# Patient Record
Sex: Female | Born: 1960 | Race: White | Hispanic: No | Marital: Married | Smoking: Never smoker
Health system: Southern US, Community
[De-identification: ages and names within clinical notes are randomized; demographics above are authoritative.]

## PROBLEM LIST (undated history)

## (undated) DIAGNOSIS — F329 Major depressive disorder, single episode, unspecified: Secondary | ICD-10-CM

## (undated) DIAGNOSIS — K259 Gastric ulcer, unspecified as acute or chronic, without hemorrhage or perforation: Secondary | ICD-10-CM

## (undated) DIAGNOSIS — D649 Anemia, unspecified: Secondary | ICD-10-CM

## (undated) DIAGNOSIS — K449 Diaphragmatic hernia without obstruction or gangrene: Secondary | ICD-10-CM

## (undated) DIAGNOSIS — F209 Schizophrenia, unspecified: Secondary | ICD-10-CM

## (undated) DIAGNOSIS — F32A Depression, unspecified: Secondary | ICD-10-CM

## (undated) HISTORY — DX: Schizophrenia, unspecified: F20.9

## (undated) HISTORY — DX: Anemia, unspecified: D64.9

## (undated) HISTORY — DX: Gastric ulcer, unspecified as acute or chronic, without hemorrhage or perforation: K25.9

## (undated) HISTORY — PX: OTHER SURGICAL HISTORY: SHX169

## (undated) HISTORY — DX: Depression, unspecified: F32.A

## (undated) HISTORY — DX: Major depressive disorder, single episode, unspecified: F32.9

## (undated) HISTORY — DX: Diaphragmatic hernia without obstruction or gangrene: K44.9

---

## 2007-05-27 ENCOUNTER — Encounter: Admission: RE | Admit: 2007-05-27 | Discharge: 2007-05-27 | Payer: Self-pay | Admitting: Obstetrics & Gynecology

## 2008-06-26 ENCOUNTER — Ambulatory Visit: Payer: Self-pay | Admitting: *Deleted

## 2008-06-26 ENCOUNTER — Ambulatory Visit: Payer: Self-pay | Admitting: Internal Medicine

## 2010-09-30 ENCOUNTER — Inpatient Hospital Stay (HOSPITAL_COMMUNITY)
Admission: EM | Admit: 2010-09-30 | Discharge: 2010-10-04 | Payer: Self-pay | Source: Home / Self Care | Attending: Internal Medicine | Admitting: Internal Medicine

## 2010-09-30 LAB — URINE MICROSCOPIC-ADD ON

## 2010-09-30 LAB — URINALYSIS, ROUTINE W REFLEX MICROSCOPIC
Bilirubin Urine: NEGATIVE
Nitrite: NEGATIVE
Protein, ur: NEGATIVE mg/dL
Specific Gravity, Urine: 1.013 (ref 1.005–1.030)
Urobilinogen, UA: 0.2 mg/dL (ref 0.0–1.0)

## 2010-09-30 LAB — BASIC METABOLIC PANEL
BUN: 4 mg/dL — ABNORMAL LOW (ref 6–23)
CO2: 20 mEq/L (ref 19–32)
Chloride: 111 mEq/L (ref 96–112)
Glucose, Bld: 93 mg/dL (ref 70–99)
Potassium: 3.7 mEq/L (ref 3.5–5.1)
Sodium: 141 mEq/L (ref 135–145)

## 2010-09-30 LAB — POCT PREGNANCY, URINE: Preg Test, Ur: NEGATIVE

## 2010-09-30 LAB — DIFFERENTIAL
Basophils Relative: 0 % (ref 0–1)
Eosinophils Absolute: 0.1 10*3/uL (ref 0.0–0.7)
Eosinophils Relative: 2 % (ref 0–5)
Lymphocytes Relative: 33 % (ref 12–46)
Neutro Abs: 3.6 10*3/uL (ref 1.7–7.7)
Neutrophils Relative %: 59 % (ref 43–77)

## 2010-09-30 LAB — CBC
HCT: 16.2 % — ABNORMAL LOW (ref 36.0–46.0)
MCH: 20.3 pg — ABNORMAL LOW (ref 26.0–34.0)
MCV: 74.7 fL — ABNORMAL LOW (ref 78.0–100.0)
RBC: 2.17 MIL/uL — ABNORMAL LOW (ref 3.87–5.11)
WBC: 6.1 10*3/uL (ref 4.0–10.5)

## 2010-10-01 DIAGNOSIS — D649 Anemia, unspecified: Secondary | ICD-10-CM

## 2010-10-01 LAB — FOLATE: Folate: 18.2 ng/mL

## 2010-10-01 LAB — VITAMIN B12: Vitamin B-12: 379 pg/mL (ref 211–911)

## 2010-10-01 LAB — HEMOGLOBIN AND HEMATOCRIT, BLOOD
HCT: 15.9 % — ABNORMAL LOW (ref 36.0–46.0)
Hemoglobin: 3.9 g/dL — CL (ref 12.0–15.0)
Hemoglobin: 4.3 g/dL — CL (ref 12.0–15.0)
Hemoglobin: 4 g/dL — CL (ref 12.0–15.0)

## 2010-10-01 LAB — IRON AND TIBC
Iron: 30 ug/dL — ABNORMAL LOW (ref 42–135)
TIBC: 447 ug/dL (ref 250–470)
UIBC: >450 ug/dL

## 2010-10-01 LAB — FERRITIN
Ferritin: 19 ng/mL (ref 10–291)
Ferritin: 17 ng/mL (ref 10–291)

## 2010-10-02 LAB — CBC
Hemoglobin: 4 g/dL — CL (ref 12.0–15.0)
MCH: 21.4 pg — ABNORMAL LOW (ref 26.0–34.0)
MCHC: 28 g/dL — ABNORMAL LOW (ref 30.0–36.0)
MCV: 76.2 fL — ABNORMAL LOW (ref 78.0–100.0)
MCV: 76.7 fL — ABNORMAL LOW (ref 78.0–100.0)
Platelets: 211 10*3/uL (ref 150–400)
Platelets: 223 10*3/uL (ref 150–400)
RBC: 1.93 MIL/uL — ABNORMAL LOW (ref 3.87–5.11)
RBC: 2.1 MIL/uL — ABNORMAL LOW (ref 3.87–5.11)
RDW: 21.6 % — ABNORMAL HIGH (ref 11.5–15.5)
WBC: 5.3 10*3/uL (ref 4.0–10.5)

## 2010-10-02 LAB — BASIC METABOLIC PANEL
BUN: 4 mg/dL — ABNORMAL LOW (ref 6–23)
Calcium: 8.5 mg/dL (ref 8.4–10.5)
Chloride: 105 mEq/L (ref 96–112)
Creatinine, Ser: 0.72 mg/dL (ref 0.4–1.2)

## 2010-10-02 LAB — HEMOGLOBIN AND HEMATOCRIT, BLOOD
HCT: 15.1 % — ABNORMAL LOW (ref 36.0–46.0)
HCT: 15.4 % — ABNORMAL LOW (ref 36.0–46.0)

## 2010-10-02 LAB — MAGNESIUM: Magnesium: 2.4 mg/dL (ref 1.5–2.5)

## 2010-10-03 LAB — FOLATE RBC: RBC Folate: 1145 ng/mL — ABNORMAL HIGH (ref 180–600)

## 2010-10-04 LAB — IGG, IGA, IGM
IgA: 412 mg/dL — ABNORMAL HIGH (ref 68–378)
IgM, Serum: 74 mg/dL (ref 60–263)

## 2010-10-04 LAB — PROTEIN ELECTROPH W RFLX QUANT IMMUNOGLOBULINS
Alpha-1-Globulin: 5.3 % — ABNORMAL HIGH (ref 2.9–4.9)
Alpha-2-Globulin: 12.6 % — ABNORMAL HIGH (ref 7.1–11.8)
Total Protein ELP: 6.8 g/dL (ref 6.0–8.3)

## 2010-10-04 LAB — ERYTHROPOIETIN: Erythropoietin: 2995 m[IU]/mL — ABNORMAL HIGH (ref 2.6–34.0)

## 2010-10-04 LAB — CBC
Hemoglobin: 4.6 g/dL — CL (ref 12.0–15.0)
MCH: 22.2 pg — ABNORMAL LOW (ref 26.0–34.0)
RBC: 2.07 MIL/uL — ABNORMAL LOW (ref 3.87–5.11)

## 2010-10-12 ENCOUNTER — Other Ambulatory Visit: Payer: Self-pay | Admitting: Oncology

## 2010-10-12 ENCOUNTER — Encounter (HOSPITAL_BASED_OUTPATIENT_CLINIC_OR_DEPARTMENT_OTHER): Payer: PRIVATE HEALTH INSURANCE | Admitting: Oncology

## 2010-10-12 ENCOUNTER — Encounter: Payer: PRIVATE HEALTH INSURANCE | Admitting: Oncology

## 2010-10-12 DIAGNOSIS — D6489 Other specified anemias: Secondary | ICD-10-CM

## 2010-10-12 LAB — CBC & DIFF AND RETIC
Eosinophils Absolute: 0.1 10*3/uL (ref 0.0–0.5)
Immature Retic Fract: 20.4 % — ABNORMAL HIGH (ref 0.00–10.70)
MCHC: 29.3 g/dL — ABNORMAL LOW (ref 31.5–36.0)
MONO#: 0.4 10*3/uL (ref 0.1–0.9)
RBC: 3.35 10*6/uL — ABNORMAL LOW (ref 3.70–5.45)
Retic %: 6.38 % — ABNORMAL HIGH (ref 0.50–1.50)
WBC: 5.4 10*3/uL (ref 3.9–10.3)
nRBC: 0 % (ref 0–0)

## 2010-10-12 LAB — COMPREHENSIVE METABOLIC PANEL
ALT: 19 U/L (ref 0–35)
Albumin: 3.8 g/dL (ref 3.5–5.2)
BUN: 11 mg/dL (ref 6–23)
CO2: 23 mEq/L (ref 19–32)
Calcium: 9.5 mg/dL (ref 8.4–10.5)
Chloride: 99 mEq/L (ref 96–112)
Creatinine, Ser: 0.85 mg/dL (ref 0.40–1.20)

## 2010-10-12 LAB — MORPHOLOGY: PLT EST: ADEQUATE

## 2010-10-12 LAB — IRON AND TIBC: TIBC: 482 ug/dL — ABNORMAL HIGH (ref 250–470)

## 2010-10-12 LAB — FERRITIN: Ferritin: 390 ng/mL — ABNORMAL HIGH (ref 10–291)

## 2010-10-15 NOTE — Discharge Summary (Signed)
[name], Sarah Calderon                  ACCOUNT NO.:  [id number]  MEDICAL RECORD NO.:  [id number]          PATIENT TYPE:  INP  LOCATION:  4711                         FACILITY:  [facility]  PHYSICIAN:  Kathlen Mody, MD       DATE OF BIRTH:  06/13/1961  DATE OF ADMISSION:  09/30/2010 DATE OF DISCHARGE:  10/04/2010                              DISCHARGE SUMMARY   PRIMARY CARE PHYSICIAN:  Dr. Renette Butters, [facility].  [facility]  HEMATOLOGIST:  Drue Second, MD  DISCHARGE DIAGNOSES:  Profound anemia, iron deficiency anemia.  OTHER DIAGNOSES:  Schizophrenia, hiatal hernia, depression.  DISCHARGE MEDICATIONS: 1. Protonix 40 mg b.i.d. 2. Ferrous sulfate 1 tablet 3 times a day. 3. Thiothixene 5 mg p.o. daily. 4. Trihexyphenidyl 2 mg daily.  CONSULTS CALLED:  Gastroenterology consult from [facility], Hematology consult from Dr. Welton Flakes.  PERTINENT LABS:  The patient had urine pregnancy test which was negative.  Urinalysis which showed small leukocytes, trace blood, urobilinogen of 0.2, ketones of 15.  Point-of-care, stool for occult blood was negative.  CBC showed a WBC count of 6.1, RBC of 2.17, hemoglobin of 4.4, hematocrit of 16.2, platelets of 200.1, MCV of 74.7.  BMP showing sodium of 141, potassium of 3.7, chloride of 111, bicarb of 20, glucose of 93, BUN of 4, creatinine of 0.64.  H&H the next day showed a hemoglobin of 3.1, hematocrit of 14.1.  Anemia panel showing vitamin B12 of 379, folate of 18.02, iron level of 70.  LDH of 148.  Direct antiglobulin test negative.  H&H showed hemoglobin of 4 and hematocrit of 14.4, haptoglobin 226.  CBC showed a hemoglobin of 4, hematocrit of 14.7, and the stool for occult blood was negative.  Vitamin B12 showed 524.  On the day of admission, the patient's CBC showed a WBC count of 6.7, hemoglobin of 4.6, hematocrit of 16.2.  RADIOLOGY:  The patient had a chest x-ray which showed left  lung atelectasis and a large hiatal hernia, no acute findings.  BRIEF HOSPITAL COURSE: 1. This is a 50 year old lady with past medical history of     schizophrenia, large hiatal hernia, and anemia came into the     hospital complaining of shortness of breath and fatigue.  In the     ER, she was found to have profound anemia with a hemoglobin of 4.     The patient is a Jehovah's witness and she refused all kinds of     blood transfusions and blood product transfusions.  The patient was     told about the risk of not getting a blood transfusion and that     might eventually lead her to her death with complications of     profound anemia if blood transfusions were not given.  The patient     and the patient's family verbally understood the risk of not     receiving blood transfusion with such profound symptomatic anemia     __________ she is a Jehovah's witness and she refused this.  She     refused all blood transfusions and blood  product transfusions.  The     patient was later admitted to the floor and she was symptomatically    treated with IV fluids and she was started on Protonix     prophylactically and the specimen of stool for occult blood was     sent for lab.  Two specimens came back negative.  Hematology     basically gave Feraheme IV iron transfusion and asked the patient     to follow up as an outpatient in about 3-4 weeks.  A     gastroenterology consult was called.  The patient had     gastroenterology workup for same kind of anemia in 2008.  She had     colonoscopy done and small bowel series was also done by Dr.     Matthias Hughs.  Her colonoscopy was normal at that time.  A small bowel     series did not show any villous pathology.  The patient also had an     upper  endoscopy which showed hiatal hernia and mild     erosive esophagitis.  She was at that time started on Prevacid and     discharged home.  She had whole workup done by gastroenterology     under Dr. Matthias Hughs.   Gastroenterology consult was also called from     the [facility] physician.  Dr. Madilyn Fireman saw the patient and recommended     hematology consult and at least 2 sets of stool specimens were     occult blood which came back negative.  DISCHARGE PHYSICAL EXAMINATION:  VITAL SIGNS:  At the time of discharge, the patient's vitals include temperature of 98.1, pulse of 73 per minute, respirations of 18 per minute, blood pressure of 114/74, saturating 99% on room air. GENERAL:  She was alert, afebrile, oriented x3 in no acute distress. All symptoms of shortness of breath and fatigue have resolved at the time of discharge. HEENT:  Pale conjunctivae. NECK:  No JVD. CARDIOVASCULAR:  S1, S2 heard.  No rubs, murmurs, or gallops. RESPIRATORY:  Good air entry bilateral.  No adventitious sounds. ABDOMEN:  Soft, nontender, nondistended.  Good bowel sounds. EXTREMITIES: No pedal edema. NEUROLOGICAL:  Nonfocal.  The patient at this time is hemodynamically stable for discharge home to follow with Hematology in 3-4 weeks where she is to check her CBC and a possible repeat IV iron infusion.  She was also recommended to follow up with gastroenterology after the hematology followup.  At the time of discharge, the patient was also told about the risk of profound anemia and possible outcomes of not getting a blood transfusion.  She and the family at the bedside understood the risk and they said that they would come to the ER if they had any problems or issues.  Time spent with the with the care of the patient and the discharge summary is about 45 minutes.          ______________________________ Kathlen Mody, MD     VA/MEDQ  D:  10/07/2010  T:  10/07/2010  Job:  [id number]  Electronically Signed by Kathlen Mody MD on 10/15/2010 03:13:46 PM

## 2010-10-20 ENCOUNTER — Encounter (HOSPITAL_BASED_OUTPATIENT_CLINIC_OR_DEPARTMENT_OTHER): Payer: PRIVATE HEALTH INSURANCE | Admitting: Oncology

## 2010-10-20 DIAGNOSIS — D6489 Other specified anemias: Secondary | ICD-10-CM

## 2010-11-07 NOTE — H&P (Signed)
Sarah Calderon, Sarah Calderon                  ACCOUNT NO.:  [id number]  MEDICAL RECORD NO.:  [id number]          PATIENT TYPE:  INP  LOCATION:  1823                         FACILITY:  [facility]  PHYSICIAN:  Della Goo, M.D. DATE OF BIRTH:  1960/10/30  DATE OF ADMISSION:  09/30/2010 DATE OF DISCHARGE:                             HISTORY & PHYSICAL   DATE OF ADMISSION:  September 30, 2010.  PRIMARY CARE PHYSICIAN:  Unassigned, Dr. Renette Butters, [facility].  CHIEF COMPLAINT:  Shortness of breath, weakness, and fatigue.  HISTORY OF PRESENT ILLNESS:  This is a 50 year old female who was referred to the emergency department by her primary care physician Dr. Renette Butters, [facility] after a finding of a hemoglobin level of 4.4.  The patient had been seen in the office secondary to complaints of weakness, lightheadedness, shortness of breath, and dyspnea on exertion which she has had for 6 months and has been worsening.  She reports having dizziness, achiness, and muscle pain and chest soreness.  She denies having any syncope.  Denies having any fevers, chills.  Denies having any nausea, vomiting, diarrhea.  She also denies having any hematemesis, hematochezia, or melena passage.  She denies having any heavy menses.  She reports her menses has been irregular and states that she has vaginal spotting twice a year.  The patient was seen in the emergency department, evaluated and her hemoglobin level was found to be 4.4 on recheck in the emergency department.  Her hematocrit 16.2 and her MCV 74.7.  A rectal examination was performed by the EDP, results of which were heme negative.  The patient was found to be hemodynamically stable and referred for medical admission.  The patient reports 2 years ago having a GI workup by Dr. Matthias [facility]hs of [facility], results of which found esophagitis and a hiatal hernia and since that time she has been on Prevacid therapy.  PAST MEDICAL  HISTORY:  Significant for schizophrenia, anemia, hiatal hernia, depression.  PAST SURGICAL HISTORY:  History of an appendectomy.  MEDICATIONS: 1. Prevacid 20 mg p.o. daily p.r.n. 2. Thiothixene 5 mg one p.o. daily. 3. Trihexyphenidyl 2 mg one p.o. daily.  ALLERGIES:  To SHELLFISH which causes hives.  SOCIAL HISTORY:  The patient is married.  She works at [employer].  She is a nonsmoker.  She reports a rare alcohol usage and denies any illicit drug usage.  FAMILY HISTORY:  Positive for coronary artery disease in her father and her paternal grandfather.  Positive cancer, lung cancer in her maternal grandfather and her maternal grandfather also had diabetic disease.  REVIEW OF SYSTEMS:  Pertinents as mentioned above.  PHYSICAL EXAMINATION FINDINGS:  GENERAL:  This is a 50 year old obese Caucasian female in no acute distress. VITAL SIGNS:  Temperature 97.1, blood pressure 125/59, heart rate 79, respirations 20, O2 saturation initially 91%, now 100%.  HEENT: Normocephalic, atraumatic.  Pupils equally round and reactive to light. Extraocular movements are intact.  Funduscopic benign.  There is no scleral icterus.  The conjunctivae are pale.  Nares are patent bilaterally.  Oropharynx is clear.  Mucosa is pale as well.  There is no exudate or erythema. NECK:  Supple.  Full range of motion.  No thyromegaly, adenopathy, jugular venous distention. CARDIOVASCULAR:  Regular rate and rhythm.  No murmurs, gallops, or rubs appreciated. LUNGS:  Clear to auscultation bilaterally.  No rales, rhonchi, or wheezes. ABDOMEN:  Positive bowel sounds, soft, nontender, nondistended.  No hepatosplenomegaly. EXTREMITIES:  Without cyanosis, clubbing, or edema. NEUROLOGIC:  Nonfocal. RECTAL:  Performed by the EDP.  LABORATORY STUDIES:  White blood cell count 6.1, hemoglobin 4.4, hematocrit 16.2, MCV 74.7, platelets 241, neutrophils 59% lymphocytes 33%.  Sodium 141, potassium 3.7, chloride 111, carbon  dioxide 20, BUN 4, creatinine 0.64, and glucose 93, calcium 8.5.  Urinalysis reveals trace urine hemoglobin, small leukocyte esterase.  Urine microscopic many urine epithelials, so this was not a clean catch.  Urine white blood cells 3-6, urine red blood cells 0-2, and bacteria are few.  Chest x-ray reveals left lung base atelectasis and a large hiatal hernia, but no acute findings are seen.  ASSESSMENT:  A 50 year old female being admitted with: 1. Severe microcytic anemia. 2. Shortness of breath secondary severe microcytic anemia. 3. Gastroesophageal reflux disease. 4. Hiatal hernia. 5. Schizophrenia. 6. Morbid obesity.  PLAN:  The patient will be admitted to a telemetry area for monitoring. Please note that the patient is a Jehovah's Witness and refuses all types of blood products.  This has been discussed with her and she is steadfast that she will not accept blood products under any circumstances.  Hemoglobin levels will be checked q.8 hours x48 hours and her stools will be heme tested once daily for 3 days.  She will be placed on Protonix therapy 40 mg IV q.12 hours.  An anemia panel has been ordered and an Epogen level has also been ordered.  The patient will be put on clear liquids at this time and IV fluids have been ordered as well for maintenance therapy.  The patient has been placed on supplemental oxygen and her oxygen levels will be monitored.  A Hematology consultation will be requested in the a.m. for further workup in the event that this is a hemoglobinopathy or hemoglobin production problem.  However, a GI consultation will also be considered.  The patient will be placed in SCDs for DVT prophylaxis at this time and patient is a full code.    Della Goo, M.D.    HJ/MEDQ  D:  10/01/2010  T:  10/01/2010  Job:  [id number]  Electronically Signed by Della Goo M.D. on 11/07/2010 08:06:56 PM

## 2010-11-10 NOTE — Consult Note (Signed)
  Sarah Calderon, Sarah Calderon                  ACCOUNT NO.:  [id number]  MEDICAL RECORD NO.:  [id number]          PATIENT TYPE:  INP  LOCATION:  4711                         FACILITY:  [facility]  PHYSICIAN:  Loveah Like C. Madilyn Fireman, M.D.    DATE OF BIRTH:  1961/06/05  DATE OF CONSULTATION:  10/02/2010 DATE OF DISCHARGE:                                CONSULTATION   REASON FOR CONSULTATION:  Severe anemia.  HISTORY OF PRESENT ILLNESS:  The patient is a 50 year old white female, who presents with symptoms of fatigue and dyspnea on exertion, who was found to have a hemoglobin of 4.4 with an MCV of 74.7.  She states she has been followed intermittently for anemia, and has been recommended to have iron, but is not taking because it upsets her stomach.  She had a GI workup by Dr. Matthias Hughs in October 2008 with an EGD that showed a large estimated 10-cm hiatal hernia with Sheria Lang erosions as well as some linear erosions in her esophagus.  From limited availability of her reports, it appears there was a colonoscopy done on that day and the patient states that there was, but I could not find the report currently.  Apparently, no polyps were found.  She was recommended to take proton pump inhibitor and she does take Prevacid, but only on a p.r.n. basis for heartburn.  She states she has been taking Advil about 2 or 4 weeks.  She denies any hematochezia, black stools, change in bowel habits, dysphagia, nausea, vomiting, abdominal pain, or weight loss.  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  PAST MEDICAL HISTORY:  Schizophrenia, anemia, hiatal hernia, depression.  MEDICATIONS:  Prevacid, thiothixene, trihexyphenidyl.  ALLERGIES:  SHELLFISH.  SOCIAL HISTORY:  The patient is married.  She works in [employer].  She denies cigarette smoking.  Drinks alcohol rarely.  FAMILY HISTORY:  Apparently positive for anemia and some other relatives.  No specific diagnoses.  Negative for GI  malignancy.  PHYSICAL EXAMINATION:  GENERAL:  Well-developed, well-nourished white female in no acute distress, pale, alert and oriented. HEART:  Regular rate and rhythm without murmurs. LUNGS:  Clear. ABDOMEN:  Soft, nondistended with normoactive bowel sounds.  No hepatosplenomegaly, mass, or guarding.  LABORATORY DATA:  MCV 76.2, hemoglobin 4, platelets 211,000, WBC 5600.  IMPRESSION:  Anemia, unclear whether due to gastrointestinal blood loss with single heme-negative stool in the emergency room.  PLAN:  We will await ordered Hemoccults and we will search further for records of colonoscopy in 2008 to decide whether any endoscopic workup versus hematology evaluation is needed.  We will follow with you.          ______________________________ Everardo All. Madilyn Fireman, M.D.     JCH/MEDQ  D:  10/02/2010  T:  10/02/2010  Job:  [id number]  Electronically Signed by Dorena Cookey M.D. on 11/08/2010 07:04:24 PM

## 2010-11-17 ENCOUNTER — Other Ambulatory Visit: Payer: Self-pay | Admitting: Oncology

## 2010-11-17 ENCOUNTER — Encounter (HOSPITAL_BASED_OUTPATIENT_CLINIC_OR_DEPARTMENT_OTHER): Payer: PRIVATE HEALTH INSURANCE | Admitting: Oncology

## 2010-11-17 DIAGNOSIS — D6489 Other specified anemias: Secondary | ICD-10-CM

## 2010-11-17 LAB — IRON AND TIBC
%SAT: 16 % — ABNORMAL LOW (ref 20–55)
TIBC: 321 ug/dL (ref 250–470)
UIBC: 271 ug/dL

## 2010-11-17 LAB — CBC & DIFF AND RETIC
Basophils Absolute: 0 10*3/uL (ref 0.0–0.1)
EOS%: 1 % (ref 0.0–7.0)
HGB: 11.6 g/dL (ref 11.6–15.9)
LYMPH%: 15.9 % (ref 14.0–49.7)
MCH: 31 pg (ref 25.1–34.0)
MCV: 94.4 fL (ref 79.5–101.0)
MONO%: 5.1 % (ref 0.0–14.0)
NEUT%: 77.9 % — ABNORMAL HIGH (ref 38.4–76.8)
Platelets: 236 10*3/uL (ref 145–400)
RDW: 22.7 % — ABNORMAL HIGH (ref 11.2–14.5)

## 2010-12-27 ENCOUNTER — Other Ambulatory Visit: Payer: Self-pay | Admitting: Gastroenterology

## 2010-12-27 ENCOUNTER — Ambulatory Visit (HOSPITAL_COMMUNITY)
Admission: RE | Admit: 2010-12-27 | Discharge: 2010-12-27 | Disposition: A | Discharge status: Home / Self Care | Payer: PRIVATE HEALTH INSURANCE | Source: Ambulatory Visit | Attending: Gastroenterology | Admitting: Gastroenterology

## 2010-12-27 DIAGNOSIS — K5730 Diverticulosis of large intestine without perforation or abscess without bleeding: Secondary | ICD-10-CM | POA: Insufficient documentation

## 2010-12-27 DIAGNOSIS — D509 Iron deficiency anemia, unspecified: Secondary | ICD-10-CM | POA: Insufficient documentation

## 2010-12-27 DIAGNOSIS — K449 Diaphragmatic hernia without obstruction or gangrene: Secondary | ICD-10-CM | POA: Insufficient documentation

## 2011-01-17 ENCOUNTER — Encounter (HOSPITAL_BASED_OUTPATIENT_CLINIC_OR_DEPARTMENT_OTHER): Payer: PRIVATE HEALTH INSURANCE | Admitting: Oncology

## 2011-01-17 ENCOUNTER — Other Ambulatory Visit: Payer: Self-pay | Admitting: Oncology

## 2011-01-17 DIAGNOSIS — D6489 Other specified anemias: Secondary | ICD-10-CM

## 2011-01-17 LAB — CBC WITH DIFFERENTIAL/PLATELET
BASO%: 0.5 % (ref 0.0–2.0)
Eosinophils Absolute: 0.1 10*3/uL (ref 0.0–0.5)
MCHC: 34.7 g/dL (ref 31.5–36.0)
MONO#: 0.4 10*3/uL (ref 0.1–0.9)
NEUT#: 4 10*3/uL (ref 1.5–6.5)
RBC: 3.36 10*6/uL — ABNORMAL LOW (ref 3.70–5.45)
RDW: 13.9 % (ref 11.2–14.5)
WBC: 6.3 10*3/uL (ref 3.9–10.3)

## 2011-02-14 ENCOUNTER — Other Ambulatory Visit: Payer: Self-pay | Admitting: Gastroenterology

## 2011-02-14 DIAGNOSIS — D649 Anemia, unspecified: Secondary | ICD-10-CM

## 2011-02-16 ENCOUNTER — Ambulatory Visit
Admission: RE | Admit: 2011-02-16 | Discharge: 2011-02-16 | Disposition: A | Discharge status: Home / Self Care | Payer: PRIVATE HEALTH INSURANCE | Source: Ambulatory Visit | Attending: Gastroenterology | Admitting: Gastroenterology

## 2011-02-16 DIAGNOSIS — D649 Anemia, unspecified: Secondary | ICD-10-CM

## 2011-03-05 HISTORY — PX: LAPAROSCOPIC NISSEN FUNDOPLICATION: SHX1932

## 2011-03-16 ENCOUNTER — Encounter (HOSPITAL_COMMUNITY): Payer: PRIVATE HEALTH INSURANCE

## 2011-03-16 ENCOUNTER — Other Ambulatory Visit (INDEPENDENT_AMBULATORY_CARE_PROVIDER_SITE_OTHER): Payer: Self-pay | Admitting: Surgery

## 2011-03-16 LAB — COMPREHENSIVE METABOLIC PANEL
CO2: 27 mEq/L (ref 19–32)
Calcium: 9.9 mg/dL (ref 8.4–10.5)
Creatinine, Ser: 0.76 mg/dL (ref 0.50–1.10)
GFR calc Af Amer: >60 mL/min (ref >60)
GFR calc non Af Amer: >60 mL/min (ref >60)
Glucose, Bld: 90 mg/dL (ref 70–99)

## 2011-03-16 LAB — PROTIME-INR: INR: 0.93 (ref 0.00–1.49)

## 2011-03-16 LAB — CBC
MCV: 96.9 fL (ref 78.0–100.0)
Platelets: 249 10*3/uL (ref 150–400)
RBC: 3.82 MIL/uL — ABNORMAL LOW (ref 3.87–5.11)
RDW: 14.2 % (ref 11.5–15.5)
WBC: 7.9 10*3/uL (ref 4.0–10.5)

## 2011-03-16 LAB — URINALYSIS, ROUTINE W REFLEX MICROSCOPIC
Bilirubin Urine: NEGATIVE
Glucose, UA: NEGATIVE mg/dL
Hgb urine dipstick: NEGATIVE
Ketones, ur: NEGATIVE mg/dL
Leukocytes, UA: NEGATIVE
Nitrite: NEGATIVE
Protein, ur: NEGATIVE mg/dL
Specific Gravity, Urine: 1.017 (ref 1.005–1.030)
Urobilinogen, UA: 0.2 mg/dL (ref 0.0–1.0)
pH: 6 (ref 5.0–8.0)

## 2011-03-16 LAB — DIFFERENTIAL
Basophils Absolute: 0 10*3/uL (ref 0.0–0.1)
Basophils Relative: 0 % (ref 0–1)
Eosinophils Absolute: 0.1 10*3/uL (ref 0.0–0.7)
Eosinophils Relative: 1 % (ref 0–5)
Lymphocytes Relative: 27 % (ref 12–46)
Lymphs Abs: 2.2 10*3/uL (ref 0.7–4.0)
Monocytes Absolute: 0.7 10*3/uL (ref 0.1–1.0)
Monocytes Relative: 9 % (ref 3–12)
Neutro Abs: 5 10*3/uL (ref 1.7–7.7)
Neutrophils Relative %: 62 % (ref 43–77)

## 2011-03-16 LAB — CEA: CEA: 1.8 ng/mL (ref 0.0–5.0)

## 2011-03-16 LAB — SURGICAL PCR SCREEN
MRSA, PCR: NEGATIVE
Staphylococcus aureus: POSITIVE — AB

## 2011-03-16 LAB — APTT: aPTT: 32 seconds (ref 24–37)

## 2011-03-24 ENCOUNTER — Encounter (INDEPENDENT_AMBULATORY_CARE_PROVIDER_SITE_OTHER): Payer: Self-pay | Admitting: Surgery

## 2011-03-24 ENCOUNTER — Other Ambulatory Visit (INDEPENDENT_AMBULATORY_CARE_PROVIDER_SITE_OTHER): Payer: Self-pay | Admitting: Surgery

## 2011-03-24 DIAGNOSIS — K449 Diaphragmatic hernia without obstruction or gangrene: Secondary | ICD-10-CM | POA: Insufficient documentation

## 2011-03-24 DIAGNOSIS — D649 Anemia, unspecified: Secondary | ICD-10-CM | POA: Insufficient documentation

## 2011-03-24 NOTE — Progress Notes (Signed)
The patient was seen by me on Feb 02, 2011 and laparoscopic foregut surgery was discussed including hiatal hernia repair and Nissen fundoplication.  She will not take blood transfusions.  Her iron deficiency anemia has responded to oral iron. After discussion with her and her family, decided to proceed with hiatus hernia repair with Nissen fundoplication

## 2011-03-28 ENCOUNTER — Inpatient Hospital Stay (HOSPITAL_COMMUNITY)
Admission: RE | Admit: 2011-03-28 | Discharge: 2011-03-31 | DRG: 328 | Disposition: A | Discharge status: Home / Self Care | Payer: PRIVATE HEALTH INSURANCE | Source: Ambulatory Visit | Attending: Surgery | Admitting: Surgery

## 2011-03-28 DIAGNOSIS — K210 Gastro-esophageal reflux disease with esophagitis: Secondary | ICD-10-CM

## 2011-03-28 DIAGNOSIS — F3289 Other specified depressive episodes: Secondary | ICD-10-CM | POA: Diagnosis present

## 2011-03-28 DIAGNOSIS — K219 Gastro-esophageal reflux disease without esophagitis: Secondary | ICD-10-CM | POA: Diagnosis present

## 2011-03-28 DIAGNOSIS — F209 Schizophrenia, unspecified: Secondary | ICD-10-CM | POA: Diagnosis present

## 2011-03-28 DIAGNOSIS — K449 Diaphragmatic hernia without obstruction or gangrene: Secondary | ICD-10-CM

## 2011-03-28 DIAGNOSIS — F329 Major depressive disorder, single episode, unspecified: Secondary | ICD-10-CM | POA: Diagnosis present

## 2011-03-29 ENCOUNTER — Inpatient Hospital Stay (HOSPITAL_COMMUNITY): Payer: PRIVATE HEALTH INSURANCE

## 2011-03-29 LAB — DIFFERENTIAL
Basophils Absolute: 0 10*3/uL (ref 0.0–0.1)
Basophils Relative: 0 % (ref 0–1)
Eosinophils Absolute: 0 10*3/uL (ref 0.0–0.7)
Eosinophils Relative: 0 % (ref 0–5)

## 2011-03-29 LAB — CBC
MCV: 95.8 fL (ref 78.0–100.0)
Platelets: 215 10*3/uL (ref 150–400)
RDW: 14.3 % (ref 11.5–15.5)
WBC: 11.3 10*3/uL — ABNORMAL HIGH (ref 4.0–10.5)

## 2011-03-29 MED ORDER — IOHEXOL 300 MG/ML  SOLN
35.0000 mL | Freq: Once | INTRAMUSCULAR | Status: AC | PRN
  Administered 2011-03-29: 35 mL via ORAL

## 2011-03-31 LAB — CBC
MCHC: 33 g/dL (ref 30.0–36.0)
Platelets: 162 10*3/uL (ref 150–400)
RDW: 14.7 % (ref 11.5–15.5)
WBC: 8.2 10*3/uL (ref 4.0–10.5)

## 2011-04-06 NOTE — H&P (Signed)
  NAMEKASHIA, BROSSARD                  ACCOUNT NO.:  [id number]  MEDICAL RECORD NO.:  [id number]  LOCATION:  1527                         FACILITY:  [facility]  PHYSICIAN:  Thornton Park. Daphine Deutscher, MD  DATE OF BIRTH:  [birth year]  DATE OF ADMISSION:  03/28/2011 DATE OF DISCHARGE:                             HISTORY & PHYSICAL   CHIEF COMPLAINT:  Hiatal hernia with gastroesophageal reflux.  HISTORY:  This 50 year old white female was referred by Dr. Burnell Blanks and was seen in the office on Feb 02, 2011, for a long-standing gastroesophageal reflux disease.  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  She was seen by me with her and her husband and we discussed laparoscopic repair of her type 3 mixed hiatal hernia, which was just found on GI series.  They understood and were aware of the risks and wanted to schedule surgery at this time.  PAST MEDICAL HISTORY:  The patient has medical problems including mental illness, including depression, possible schizophrenia for which she takes Navane 5 mg 1 h.s. (thiothixene).  She had an appendectomy back in 1992.  ALLERGIES:  Listed to [facility], nothing else.  The patient does not take Coumadin and Plavix, sometimes aspirin.  She is not allergic to latex.  She is not diabetic.  FAMILY HISTORY:  Parents are both living.  SOCIAL HISTORY:  She has 2 living sisters, 1 deceased.  She is married, 1 child.  Does not smoke, occasionally drinks.  REVIEW OF SYSTEMS:  15-point review of systems was positive for rash, easy bruisability, some blurred visual issues at times.  PHYSICAL EXAMINATION:  GENERAL:  A 50 year old white female in no acute distress. HEENT:  Unremarkable. NECK:  Supple. CHEST:  Clear to auscultation. HEART:  Sinus rhythm. ABDOMEN:  Mildly obese without masses or organomegaly. EXTREMITIES:  Full range of motion. NEURO:  Alert and  oriented x3.  Motor and sensory function grossly intact.  IMPRESSION:  A sizable hiatal hernia in a patient with history of anemia, fortunately built back up with iron, who refuses blood transfusion.  PLAN:  Laparoscopic Nissen fundoplication.     Thornton Park Daphine Deutscher, MD     MBM/MEDQ  D:  03/31/2011  T:  03/31/2011  Job:  [id number]  Electronically Signed by Luretha Murphy MD on 04/06/2011 07:25:34 AM

## 2011-04-06 NOTE — Discharge Summary (Signed)
  NAMEJACARI, Sarah Calderon                  ACCOUNT NO.:  [id number]  MEDICAL RECORD NO.:  [id number]  LOCATION:  1527                         FACILITY:  [facility]  PHYSICIAN:  Thornton Park. Daphine Deutscher, MD  DATE OF BIRTH:  November 25, 1960  DATE OF ADMISSION:  03/28/2011 DATE OF DISCHARGE:  03/31/2011                              DISCHARGE SUMMARY   PREOPERATIVE INDICATIONS:  50 year old white female with long history of GERD and hiatal hernia type 3.  PROCEDURE:  Laparoscopic repair of hiatal hernia with 2-suture posterior closure and a Nissen fundoplication over 15-French bougie.  COURSE IN THE HOSPITAL:  This 50 year old lady was taken to room 1 at [place] on July 24 and underwent the above-mentioned procedure.  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  On postop day 1, she had a swallow which showed her to have a good wrap and repair and I reviewed that.  She was started on liquids which she tolerated, advanced to full liquids and was ready for discharge on postop day #3 which was March 30, 2011.  She was given prescription for Roxicet Elixir if she needs it for pain, and she is going to try to return to work on August 10.  I will see her back in the office in 4 weeks.  She was advised to stay on clear liquids through next Tuesday and then start a puree diet and carry that on for about 4 weeks.  CONDITION:  Improved.     Thornton Park Daphine Deutscher, MD     MBM/MEDQ  D:  03/31/2011  T:  03/31/2011  Job:  [id number]  cc:   Burnell Blanks, MD Fax: [phone]  Electronically Signed by Luretha Murphy MD on 04/06/2011 07:25:30 AM

## 2011-04-06 NOTE — Op Note (Signed)
Sarah Calderon, Sarah Calderon                  ACCOUNT NO.:  [id number]  MEDICAL RECORD NO.:  [id number]  LOCATION:  DAYL                         FACILITY:  [facility]  PHYSICIAN:  Thornton Park. Daphine Deutscher, MD  DATE OF BIRTH:  Jul 29, 1961  DATE OF PROCEDURE:  03/28/2011 DATE OF DISCHARGE:                              OPERATIVE REPORT   PREOPERATIVE DIAGNOSIS:  Moderate-sized hiatal hernia with longstanding gastroesophageal reflux.  PROCEDURE:  Laparoscopic repair of a hiatal hernia with takedown of the sac, two pledgeted suture posterior closure calibrated with a #50 lighted bougie, wrap done over 50 lighted bougie Nissen with three sutures.  SURGEON:  Thornton Park. Daphine Deutscher, M.D.  ASSISTANT:  Sharlet Salina T. Hoxworth, M.D.  ANESTHESIA:  General endotracheal.  DESCRIPTION OF PROCEDURE:  On Tuesday, March 28, 2011, Sarah Calderon was taken to the OR.  Preoperatively, she was seen in the holding area and I went back over the procedure in detail.  Risks, benefits and clarified her position as we do have the witness on blood products.  Physical exam was again performed in the holding area and then she was taken back to the operating room.  Time-out procedure was performed and following that, the abdomen was prepped with PCA max and draped sterilely.  The abdomen was entered through the left upper quadrant using a 0 degree 5 mm OptiVu without difficulty.  Standard trocar placements included two 5s laterally on the left, a 5 to the left of the umbilicus for the camera, which was an angle 30 and an 11:12 to the right umbilicus and another one in the left side and another 5 was used to place the Cable retractor beneath the left lateral segment exposing an obvious diaphragmatic hernia with stomach retracted up into the chest.  We were able to grasp this and bring it down into the abdomen and then using the harmonic scalpel, I took down the sac starting over on the right crus and going up to the reflection of the  sac and around the crus on the right side and then the left side.  I then took down short gastric vessels to free the cardia and fundus up in the upper portion.  There was no bleeding noted from the spleen or any of the short gastrics.  We had good exposure of the crura both right and left from the patient's right side and at that point, I did the pledgeted closure using two sutures held in place with tie knots.  With this exposure I then reached around and grasped the portion of the stomach and brought around and after appropriate shoeshine maneuver, we identified contiguous portion of the stomach for the distal esophageal invagination.  The wrap was then brought up around the esophagus with the 50 bougie in place where it was sutured with three Endo stitch sutures held in place with tie knots.  The area looked good.  Trocar sites were all injected with some 0.25% Marcaine.  The abdomen was deflated.  The liver looked good. Everything appeared in order and wounds were closed with 4-0 Vicryl with Dermabond.  Patient was taken to the recovery room in satisfactory condition.  Thornton Park Daphine Deutscher, MD     MBM/MEDQ  D:  03/28/2011  T:  03/28/2011  Job:  [id number]  Electronically Signed by Luretha Murphy MD on 04/06/2011 07:25:26 AM

## 2011-04-12 ENCOUNTER — Telehealth (INDEPENDENT_AMBULATORY_CARE_PROVIDER_SITE_OTHER): Payer: Self-pay

## 2011-04-12 NOTE — Telephone Encounter (Signed)
Patient called and states the prescription she was given a couple weeks ago was liquid Oxycodone and it was expensive.  She requests the pills Oxycodone 5-325.  Her husband states he doesn't know where the prescription is.  I had told them they need to bring it in to switch it out with the new rx.  Please call her if and when she can get her new rx. [phone]

## 2011-04-19 ENCOUNTER — Encounter (INDEPENDENT_AMBULATORY_CARE_PROVIDER_SITE_OTHER): Payer: Self-pay | Admitting: General Surgery

## 2011-04-21 ENCOUNTER — Ambulatory Visit (INDEPENDENT_AMBULATORY_CARE_PROVIDER_SITE_OTHER): Payer: PRIVATE HEALTH INSURANCE | Admitting: Surgery

## 2011-04-21 VITALS — Temp 97.9°F

## 2011-04-21 DIAGNOSIS — Z9889 Other specified postprocedural states: Secondary | ICD-10-CM

## 2011-04-21 DIAGNOSIS — Z09 Encounter for follow-up examination after completed treatment for conditions other than malignant neoplasm: Secondary | ICD-10-CM

## 2011-04-21 NOTE — Progress Notes (Signed)
Ms. Sarah Calderon reported back to day and she was RD regular food. Her incisions have healed well and she is remarkably better. She reports no complaints and is happy. I will see her again in 3 months.

## 2011-08-10 ENCOUNTER — Encounter (INDEPENDENT_AMBULATORY_CARE_PROVIDER_SITE_OTHER): Payer: Self-pay | Admitting: Surgery

## 2011-08-10 ENCOUNTER — Ambulatory Visit (INDEPENDENT_AMBULATORY_CARE_PROVIDER_SITE_OTHER): Payer: PRIVATE HEALTH INSURANCE | Admitting: Surgery

## 2011-08-10 VITALS — BP 118/84 | HR 76 | Temp 96.9°F | Resp 16 | Ht 59.0 in | Wt 161.4 lb

## 2011-08-10 DIAGNOSIS — Z9889 Other specified postprocedural states: Secondary | ICD-10-CM | POA: Insufficient documentation

## 2011-08-10 DIAGNOSIS — Z09 Encounter for follow-up examination after completed treatment for conditions other than malignant neoplasm: Secondary | ICD-10-CM

## 2011-08-10 NOTE — Progress Notes (Signed)
[facility] Progress Note:   @ORDAYSPST @  Subjective: Complaining of increased flatulence.  No GER  Objective: Vital signs in last 24 hours: @VSRANGES @  Intake/Output from previous day:   Intake/Output this shift: @IOTHISSHIFT @  Physical Exam:  Incisions bland Lab Results:  No results found for this basename: WBC:2,HGB:2,HCT:2,PLT:2 in the last 72 hours BMET No results found for this basename: NA:2,K:2,CL:2,CO2:2,GLUCOSE:2,BUN:2,CREATININE:2,CALCIUM:2 in the last 72 hours PT/INR No results found for this basename: LABPROT:2,INR:2 in the last 72 hours Studies/Results: No results found. Anti-infectives: Anti-infectives    None      Assessment/Plan: Problem List: Patient Active Problem List  Diagnoses  . HH (hiatus hernia)  . Anemia    Back at work at [employer] in [employer].  Doing well  Return prn @ORDAYSPST @   @RRHLOS @  Matt B. Daphine Deutscher, MD, [facility] [phone] beeper [phone]  08/10/2011 9:38 AM

## 2011-08-10 NOTE — Patient Instructions (Signed)
Return if needed for GER

## 2011-08-14 IMAGING — RF DG ESOPHAGUS
11 series · 11 of 11 positions shown · non-contrast
Comparison: Upper GI pain 02/16/2011

CLINICAL DATA: Postop Nissen fundoplication

ESOPHOGRAM/BARIUM SWALLOW
TECHNIQUE: Combined double contrast and single contrast
examination performed using effervescent crystals, thick barium
liquid, and thin barium liquid.
Fluoroscopy time:  1.2 minutes.

[Series 1: run · 1 of 1 slices shown (1 of 11)]
[im 1/1]
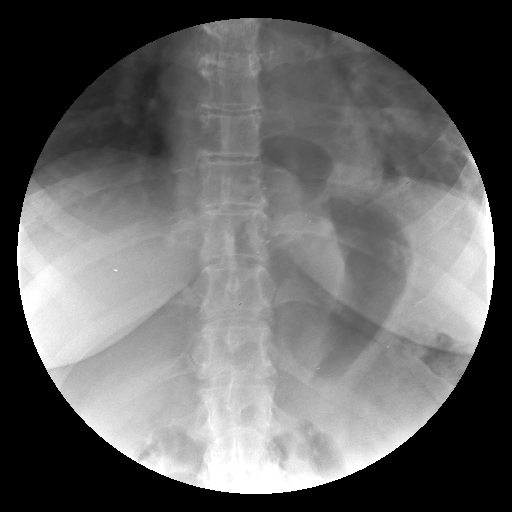

[Series 2: run · 1 of 1 slices shown (2 of 11)]
[im 1/1]
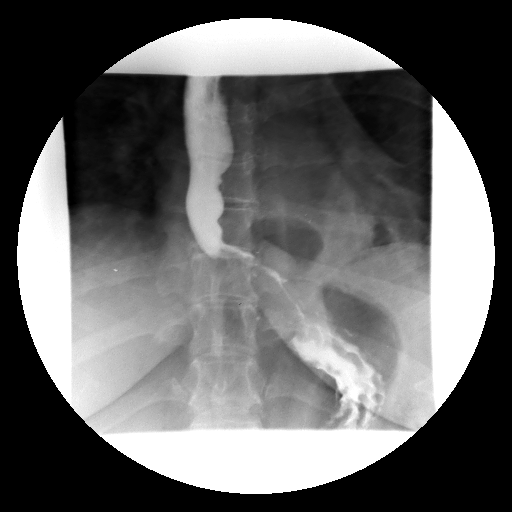

[Series 3: run · 1 of 1 slices shown (3 of 11)]
[im 1/1]
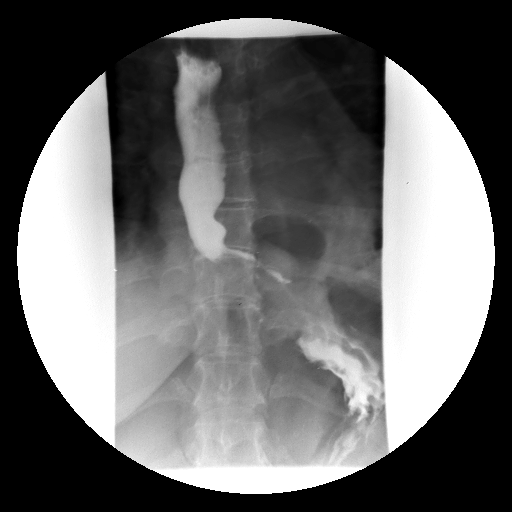

[Series 4: run · 1 of 1 slices shown (4 of 11)]
[im 1/1]
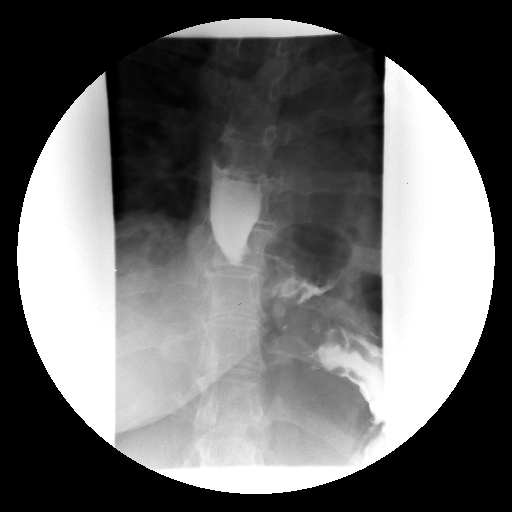

[Series 5: run · 1 of 1 slices shown (5 of 11)]
[im 1/1]
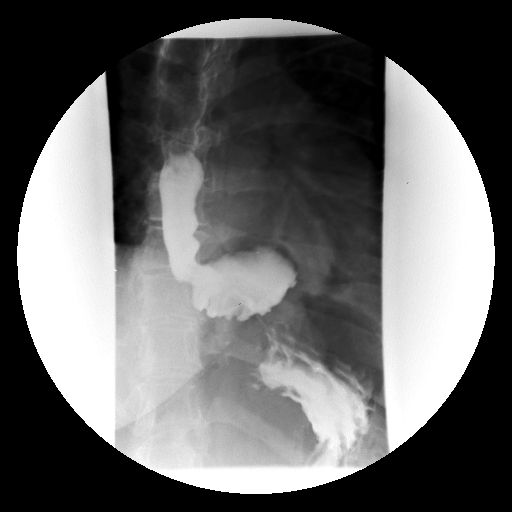

[Series 6: run · 1 of 1 slices shown (6 of 11)]
[im 1/1]
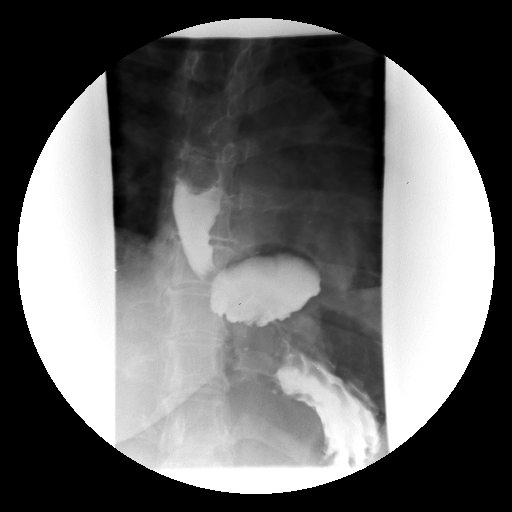

[Series 7: run · 1 of 1 slices shown (7 of 11)]
[im 1/1]
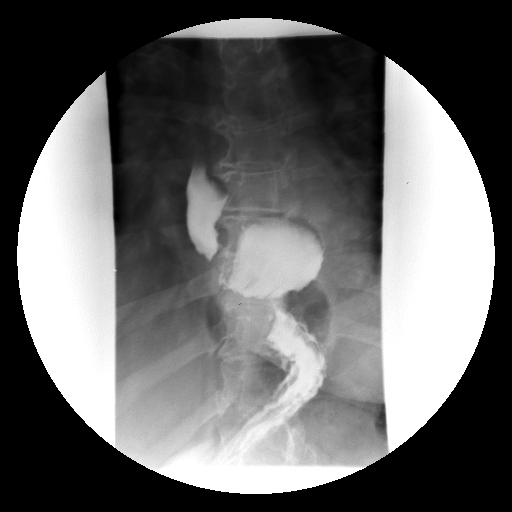

[Series 8: run · 1 of 1 slices shown (8 of 11)]
[im 1/1]
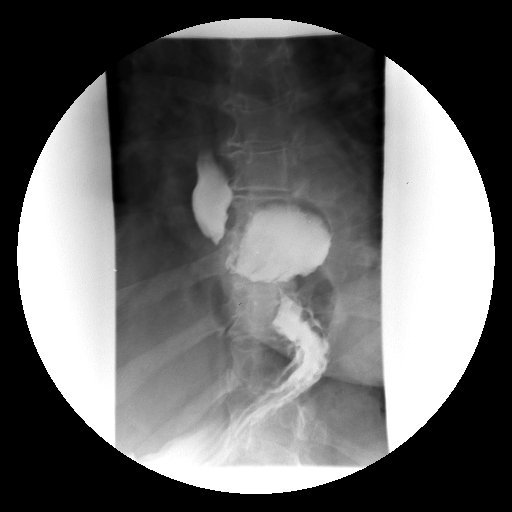

[Series 9: run · 1 of 1 slices shown (9 of 11)]
[im 1/1]
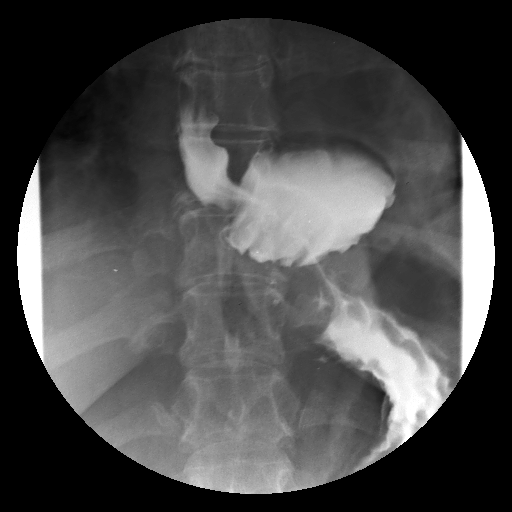

[Series 10: run · 1 of 1 slices shown (10 of 11)]
[im 1/1]
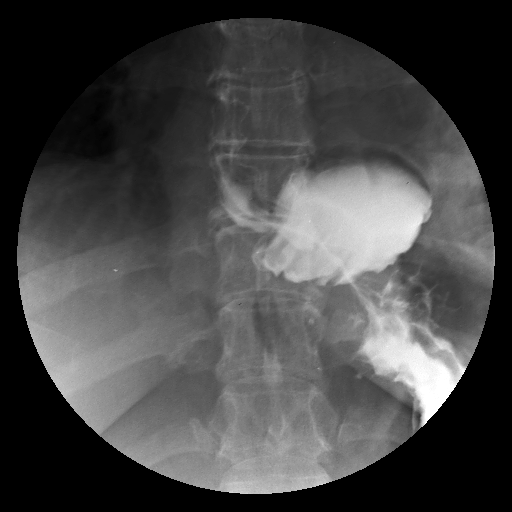

[Series 11: run · 1 of 1 slices shown (11 of 11)]
[im 1/1]
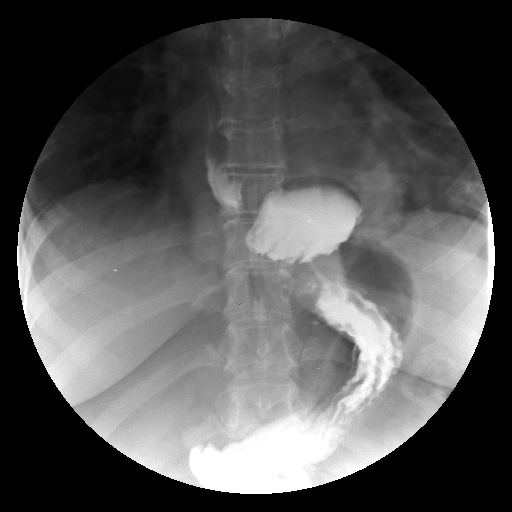

[11 of 11 positions shown; findings below may reference images not displayed]

FINDINGS: Following Nissen fundoplication, the hiatal hernia has
been brought beneath the hemidiaphragms. Contrast flows readily
through the proximal wrap at the GE junction. With the patient in
the left side down position, contrast fills a pouch-like superior
portion of the fundus/wrap.  Contrast flows distal into the gastric
antrum.
IMPRESSION: No evidence of obstruction or leak following gastric
fundoplication.

Findings discussed with Dr. Piech on 03/29/2011 at time of
interpretation.

## 2011-08-22 ENCOUNTER — Telehealth: Payer: Self-pay | Admitting: *Deleted

## 2011-08-22 NOTE — Telephone Encounter (Signed)
patient called in to change her appointment to the 09-28-2011 starting at 8:30am

## 2011-08-23 ENCOUNTER — Other Ambulatory Visit: Payer: PRIVATE HEALTH INSURANCE

## 2011-08-23 ENCOUNTER — Ambulatory Visit: Payer: PRIVATE HEALTH INSURANCE | Admitting: Oncology

## 2011-09-27 ENCOUNTER — Other Ambulatory Visit: Payer: Self-pay | Admitting: Family

## 2011-09-27 DIAGNOSIS — D509 Iron deficiency anemia, unspecified: Secondary | ICD-10-CM

## 2011-09-28 ENCOUNTER — Ambulatory Visit (HOSPITAL_BASED_OUTPATIENT_CLINIC_OR_DEPARTMENT_OTHER): Payer: PRIVATE HEALTH INSURANCE | Admitting: Family

## 2011-09-28 ENCOUNTER — Other Ambulatory Visit: Payer: PRIVATE HEALTH INSURANCE | Admitting: Lab

## 2011-09-28 ENCOUNTER — Encounter: Payer: Self-pay | Admitting: Family

## 2011-09-28 ENCOUNTER — Telehealth: Payer: Self-pay | Admitting: Oncology

## 2011-09-28 VITALS — BP 147/83 | HR 90 | Temp 97.7°F | Ht 59.0 in | Wt 162.0 lb

## 2011-09-28 DIAGNOSIS — D509 Iron deficiency anemia, unspecified: Secondary | ICD-10-CM

## 2011-09-28 LAB — FERRITIN: Ferritin: 37 ng/mL (ref 10–291)

## 2011-09-28 LAB — CBC WITH DIFFERENTIAL/PLATELET
Eosinophils Absolute: 0.1 10*3/uL (ref 0.0–0.5)
HGB: 13.4 g/dL (ref 11.6–15.9)
MONO#: 0.5 10*3/uL (ref 0.1–0.9)
MONO%: 8.2 % (ref 0.0–14.0)
NEUT#: 4.5 10*3/uL (ref 1.5–6.5)
RBC: 4.07 10*6/uL (ref 3.70–5.45)
RDW: 14.6 % — ABNORMAL HIGH (ref 11.2–14.5)
WBC: 6.7 10*3/uL (ref 3.9–10.3)
lymph#: 1.5 10*3/uL (ref 0.9–3.3)

## 2011-09-28 LAB — IRON AND TIBC
%SAT: 14 % — ABNORMAL LOW (ref 20–55)
Iron: 58 ug/dL (ref 42–145)
TIBC: 419 ug/dL (ref 250–470)
UIBC: 361 ug/dL (ref 125–400)

## 2011-09-28 NOTE — Telephone Encounter (Signed)
gve the pt her July 2013 appt calendar °

## 2011-09-28 NOTE — Progress Notes (Signed)
[facility]  Name: Sarah Calderon                  DATE: 09/28/2011 MRN: [id number]                      DOB: Jan 25, 1961  REFERRING PHYSICIAN: Hamrick, Durward Fortes, MD  DIAGNOSIS: Patient Active Problem List  Diagnoses Date Noted  . Lap Nissen repair of large hiatus hernia, July 2012 08/10/2011  . Anemia, iron deficiency 03/24/2011     Encounter Diagnosis  Name Primary?  . Iron deficiency anemia    CURRENT THERAPY: Observation, IV iron as indicated.   INTERIM HISTORY: Patient has long-standing history of iron deficiency anemia, takes by mouth iron intermittently. Says she doesn't like the way it makes her feel, causes GI distress. Averages a dose approximately once to twice weekly.  Had capsule colonoscopy and barium swallow which revealed large hiatal hernia. This was repaired surgically July 2012 with good results, improvement in reflux..  No fatigue or shortness of breath. No melena. No hematuria. No fever sweats or chills. No headache or chest pain. Bowel and bladder function are normal, appetite is good.  PHYSICAL EXAM: BP 147/83  Pulse 90  Temp(Src) 97.7 F (36.5 C) (Oral)  Ht 4\' 11"  (1.499 m)  Wt 162 lb (73.483 kg)  BMI 32.72 kg/m2 General: Well developed, well nourished, in no acute distress.  EENT: No ocular or oral lesions. No stomatitis.  Respiratory: Lungs are clear to auscultation bilaterally with normal respiratory movement and no accessory muscle use. Cardiac: No murmur, rub or tachycardia. No upper or lower extremity edema.  GI: Abdomen is soft, no palpable hepatosplenomegaly. No fluid wave. No tenderness. Musculoskeletal: No kyphosis, no tenderness over the spine, ribs or hips. Lymph: No cervical, infraclavicular, axillary or inguinal adenopathy. Neuro: No focal neurological deficits. Psych: Alert and oriented X 3, appropriate mood and affect.    LABORATORY STUDIES:   Results for orders placed in visit on 09/28/11  CBC WITH DIFFERENTIAL   Component Value Range   WBC 6.7  3.9 - 10.3 (10e3/uL)   NEUT# 4.5  1.5 - 6.5 (10e3/uL)   HGB 13.4  11.6 - 15.9 (g/dL)   HCT 40.9  81.1 - 91.4 (%)   Platelets 244  145 - 400 (10e3/uL)   MCV 95.8  79.5 - 101.0 (fL)   MCH 32.9  25.1 - 34.0 (pg)   MCHC 34.4  31.5 - 36.0 (g/dL)   RBC 7.82  9.56 - 2.13 (10e6/uL)   RDW 14.6 (*) 11.2 - 14.5 (%)   lymph# 1.5  0.9 - 3.3 (10e3/uL)   MONO# 0.5  0.1 - 0.9 (10e3/uL)   Eosinophils Absolute 0.1  0.0 - 0.5 (10e3/uL)   Basophils Absolute 0.0  0.0 - 0.1 (10e3/uL)   NEUT% 67.4  38.4 - 76.8 (%)   LYMPH% 22.6  14.0 - 49.7 (%)   MONO% 8.2  0.0 - 14.0 (%)   EOS% 1.3  0.0 - 7.0 (%)   BASO% 0.5  0.0 - 2.0 (%)    IMPRESSION:  51 year old female with: 1. History and deficiency anemia, takes oral iron intermittently. Hemoglobin and MCV within normal range, iron studies pending. 2. History large hiatal hernia, repaired surgically July 2012 with good results. Reflux improved.  PLAN:   1. Check pending iron studies.  2. Return to clinic in 6 months for upcoming with Dr. Park Breed with iron studies. Orders are entered.  DISCUSSION:

## 2011-10-06 ENCOUNTER — Other Ambulatory Visit: Payer: Self-pay | Admitting: Family

## 2011-10-06 ENCOUNTER — Telehealth: Payer: Self-pay | Admitting: *Deleted

## 2011-10-06 NOTE — Telephone Encounter (Signed)
patient cofirmed over the phone the date and time for the iron infusion

## 2011-10-12 ENCOUNTER — Telehealth: Payer: Self-pay | Admitting: *Deleted

## 2011-10-12 ENCOUNTER — Ambulatory Visit: Payer: PRIVATE HEALTH INSURANCE

## 2011-10-12 ENCOUNTER — Other Ambulatory Visit: Payer: Self-pay

## 2011-10-12 NOTE — Telephone Encounter (Signed)
Message from [facility] regarding pt cancelled appt for Iron infusion today 10/12/11. Called [facility] for pt to call to Reschedule IV Iron appt.

## 2012-03-28 ENCOUNTER — Ambulatory Visit: Payer: PRIVATE HEALTH INSURANCE | Admitting: Oncology

## 2012-03-28 ENCOUNTER — Other Ambulatory Visit: Payer: PRIVATE HEALTH INSURANCE | Admitting: Lab

## 2013-05-30 ENCOUNTER — Emergency Department: Payer: Self-pay | Admitting: Emergency Medicine

## 2013-05-30 LAB — CBC
HGB: 12.9 g/dL (ref 12.0–16.0)
MCH: 31 pg (ref 26.0–34.0)
MCHC: 34.2 g/dL (ref 32.0–36.0)
Platelet: 258 10*3/uL (ref 150–440)
RBC: 4.15 10*6/uL (ref 3.80–5.20)

## 2013-05-30 LAB — URINALYSIS, COMPLETE
Bilirubin,UR: NEGATIVE
Blood: NEGATIVE
Glucose,UR: NEGATIVE mg/dL (ref 0–75)
Ketone: NEGATIVE
Nitrite: NEGATIVE
Ph: 5 (ref 4.5–8.0)
WBC UR: 80 /HPF (ref 0–5)

## 2013-05-30 LAB — COMPREHENSIVE METABOLIC PANEL
Alkaline Phosphatase: 107 U/L (ref 50–136)
Calcium, Total: 9.1 mg/dL (ref 8.5–10.1)
Chloride: 106 mmol/L (ref 98–107)
Co2: 27 mmol/L (ref 21–32)
Glucose: 96 mg/dL (ref 65–99)
Osmolality: 272 (ref 275–301)
SGOT(AST): 25 U/L (ref 15–37)
Sodium: 137 mmol/L (ref 136–145)
Total Protein: 8.2 g/dL (ref 6.4–8.2)

## 2014-08-15 ENCOUNTER — Emergency Department (HOSPITAL_COMMUNITY): Admission: EM | Admit: 2014-08-15 | Discharge: 2014-08-15 | Discharge status: Against Medical Advice | Payer: Self-pay

## 2014-08-15 ENCOUNTER — Emergency Department (HOSPITAL_COMMUNITY)
Admission: EM | Admit: 2014-08-15 | Discharge: 2014-08-15 | Disposition: A | Discharge status: Home / Self Care | Payer: BC Managed Care – PPO | Attending: Emergency Medicine | Admitting: Emergency Medicine

## 2014-08-15 ENCOUNTER — Encounter (HOSPITAL_COMMUNITY): Payer: Self-pay | Admitting: *Deleted

## 2014-08-15 DIAGNOSIS — R42 Dizziness and giddiness: Secondary | ICD-10-CM

## 2014-08-15 DIAGNOSIS — R109 Unspecified abdominal pain: Secondary | ICD-10-CM

## 2014-08-15 DIAGNOSIS — Z8659 Personal history of other mental and behavioral disorders: Secondary | ICD-10-CM | POA: Insufficient documentation

## 2014-08-15 DIAGNOSIS — M549 Dorsalgia, unspecified: Secondary | ICD-10-CM

## 2014-08-15 DIAGNOSIS — Z880 Allergy status to penicillin: Secondary | ICD-10-CM | POA: Diagnosis not present

## 2014-08-15 DIAGNOSIS — Z8719 Personal history of other diseases of the digestive system: Secondary | ICD-10-CM | POA: Diagnosis not present

## 2014-08-15 DIAGNOSIS — Z79899 Other long term (current) drug therapy: Secondary | ICD-10-CM | POA: Insufficient documentation

## 2014-08-15 DIAGNOSIS — D649 Anemia, unspecified: Secondary | ICD-10-CM | POA: Insufficient documentation

## 2014-08-15 DIAGNOSIS — H6121 Impacted cerumen, right ear: Secondary | ICD-10-CM | POA: Insufficient documentation

## 2014-08-15 DIAGNOSIS — H9201 Otalgia, right ear: Secondary | ICD-10-CM | POA: Insufficient documentation

## 2014-08-15 HISTORY — DX: Major depressive disorder, single episode, unspecified: F32.9

## 2014-08-15 LAB — URINALYSIS, ROUTINE W REFLEX MICROSCOPIC
Bilirubin Urine: NEGATIVE
Bilirubin Urine: NEGATIVE
GLUCOSE, UA: NEGATIVE mg/dL
GLUCOSE, UA: NEGATIVE mg/dL
HGB URINE DIPSTICK: NEGATIVE
Hgb urine dipstick: NEGATIVE
KETONES UR: NEGATIVE mg/dL
Ketones, ur: NEGATIVE mg/dL
NITRITE: NEGATIVE
Nitrite: NEGATIVE
PH: 7 (ref 5.0–8.0)
PH: 5.5 (ref 5.0–8.0)
PROTEIN: NEGATIVE mg/dL
Protein, ur: NEGATIVE mg/dL
SPECIFIC GRAVITY, URINE: 1.008 (ref 1.005–1.030)
SPECIFIC GRAVITY, URINE: 1.013 (ref 1.005–1.030)
Urobilinogen, UA: 0.2 mg/dL (ref 0.0–1.0)
Urobilinogen, UA: 0.2 mg/dL (ref 0.0–1.0)

## 2014-08-15 LAB — BASIC METABOLIC PANEL
Anion gap: 14 (ref 5–15)
BUN: 7 mg/dL (ref 6–23)
CALCIUM: 9.5 mg/dL (ref 8.4–10.5)
CHLORIDE: 102 meq/L (ref 96–112)
CO2: 24 meq/L (ref 19–32)
CREATININE: 0.66 mg/dL (ref 0.50–1.10)
GFR calc Af Amer: >90 mL/min (ref >90)
GFR calc non Af Amer: >90 mL/min (ref >90)
GLUCOSE: 89 mg/dL (ref 70–99)
Potassium: 4 mEq/L (ref 3.7–5.3)
SODIUM: 140 meq/L (ref 137–147)

## 2014-08-15 LAB — URINE MICROSCOPIC-ADD ON

## 2014-08-15 LAB — CBC
HCT: 38 % (ref 36.0–46.0)
HEMOGLOBIN: 12.7 g/dL (ref 12.0–15.0)
MCH: 30.8 pg (ref 26.0–34.0)
MCHC: 33.4 g/dL (ref 30.0–36.0)
MCV: 92.2 fL (ref 78.0–100.0)
Platelets: 243 10*3/uL (ref 150–400)
RBC: 4.12 MIL/uL (ref 3.87–5.11)
RDW: 17.5 % — ABNORMAL HIGH (ref 11.5–15.5)
WBC: 6.6 10*3/uL (ref 4.0–10.5)

## 2014-08-15 MED ORDER — MECLIZINE HCL 25 MG PO TABS: 25.0000 mg | ORAL_TABLET | Freq: Three times a day (TID) | ORAL | Status: AC | PRN

## 2014-08-15 MED ORDER — CARBAMIDE PEROXIDE 6.5 % OT SOLN: 5.0000 [drp] | Freq: Two times a day (BID) | OTIC | Status: AC

## 2014-08-15 MED ORDER — HYDROGEN PEROXIDE 3 % EX SOLN
CUTANEOUS | Status: AC
  Administered 2014-08-15: 14:00:00
  Filled 2014-08-15: qty 473

## 2014-08-15 NOTE — ED Notes (Signed)
Bed: [id number] Expected date: 08/15/14 Expected time:  Means of arrival:  Comments:

## 2014-08-15 NOTE — ED Notes (Signed)
Triage performed by Nuala AlphaBritany D, RN under Marya LandryHaley W, RN name

## 2014-08-15 NOTE — Discharge Instructions (Signed)
Dizziness °Dizziness is a common problem. It is a feeling of unsteadiness or light-headedness. You may feel like you are about to faint. Dizziness can lead to injury if you stumble or fall. A person of any age group can suffer from dizziness, but dizziness is more common in older adults. °CAUSES  °Dizziness can be caused by many different things, including: °· Middle ear problems. °· Standing for too long. °· Infections. °· An allergic reaction. °· Aging. °· An emotional response to something, such as the sight of blood. °· Side effects of medicines. °· Tiredness. °· Problems with circulation or blood pressure. °· Excessive use of alcohol or medicines, or illegal drug use. °· Breathing too fast (hyperventilation). °· An irregular heart rhythm (arrhythmia). °· A low red blood cell count (anemia). °· Pregnancy. °· Vomiting, diarrhea, fever, or other illnesses that cause body fluid loss (dehydration). °· Diseases or conditions such as Parkinson's disease, high blood pressure (hypertension), diabetes, and thyroid problems. °· Exposure to extreme heat. °DIAGNOSIS  °Your health care provider will ask about your symptoms, perform a physical exam, and perform an electrocardiogram (ECG) to record the electrical activity of your heart. Your health care provider may also perform other heart or blood tests to determine the cause of your dizziness. These may include: °· Transthoracic echocardiogram (TTE). During echocardiography, sound waves are used to evaluate how blood flows through your heart. °· Transesophageal echocardiogram (TEE). °· Cardiac monitoring. This allows your health care provider to monitor your heart rate and rhythm in real time. °· Holter monitor. This is a portable device that records your heartbeat and can help diagnose heart arrhythmias. It allows your health care provider to track your heart activity for several days if needed. °· Stress tests by exercise or by giving medicine that makes the heart beat  faster. °TREATMENT  °Treatment of dizziness depends on the cause of your symptoms and can vary greatly. °HOME CARE INSTRUCTIONS  °· Drink enough fluids to keep your urine clear or pale yellow. This is especially important in very hot weather. In older adults, it is also important in cold weather. °· Take your medicine exactly as directed if your dizziness is caused by medicines. When taking blood pressure medicines, it is especially important to get up slowly. °· Rise slowly from chairs and steady yourself until you feel okay. °· In the morning, first sit up on the side of the bed. When you feel okay, stand slowly while holding onto something until you know your balance is fine. °· Move your legs often if you need to stand in one place for a long time. Tighten and relax your muscles in your legs while standing. °· Have someone stay with you for 1-2 days if dizziness continues to be a problem. Do this until you feel you are well enough to stay alone. Have the person call your health care provider if he or she notices changes in you that are concerning. °· Do not drive or use heavy machinery if you feel dizzy. °· Do not drink alcohol. °SEEK IMMEDIATE MEDICAL CARE IF:  °· Your dizziness or light-headedness gets worse. °· You feel nauseous or vomit. °· You have problems talking, walking, or using your arms, hands, or legs. °· You feel weak. °· You are not thinking clearly or you have trouble forming sentences. It may take a friend or family member to notice this. °· You have chest pain, abdominal pain, shortness of breath, or sweating. °· Your vision changes. °· You notice   any bleeding.  You have side effects from medicine that seems to be getting worse rather than better. MAKE SURE YOU:   Understand these instructions.  Will watch your condition.  Will get help right away if you are not doing well or get worse. Document Released: 02/14/2001 Document Revised: 08/26/2013 Document Reviewed: 03/10/2011 [facility]  Patient Information 2015 [employer], [place]. This information is not intended to replace advice given to you by your health care provider. Make sure you discuss any questions you have with your health care provider.  Draining Ear Ear wax, pus, blood and other fluids are examples of the different types of drainage from ears. Drops or cream may be needed to lessen the itching which may occur with ear drainage. CAUSES   Skin irritations in the ear.  Ear infection.  Swimmer's ear.  Ruptured eardrum.  Foreign object in the ear canal.  Sudden pressure changes.  Head injury. HOME CARE INSTRUCTIONS   Only take over-the-counter or prescription medicines for pain, fever, or discomfort as directed by your caregiver.  Do not rub the ear canal with cotton-tipped swabs.  Do not swim until your caregiver says it is okay.  Before you take a shower, cover a cotton ball with petroleum jelly to keep water out.  Limit exposure to smoke. Secondhand smoke can increase the chance for ear infections.  Keep up with immunizations.  Wash your hands well.  Keep all follow-up appointments to examine the ear and evaluate hearing. SEEK MEDICAL CARE IF:   You have increased drainage.  You have ear pain, a fever, or drainage that is not getting better after 48 hours of antibiotics.  You are unusually tired. SEEK IMMEDIATE MEDICAL CARE IF:  You have severe ear pain or headache.  The patient is older than 3 months with a rectal or oral temperature of 102 F (38.9 C) or higher.  The patient is 493 months old or younger with a rectal temperature of 100.4 F (38 C) or higher.  You vomit.  You feel dizzy.  You have a seizure.  You have new hearing loss. MAKE SURE YOU:   Understand these instructions.  Will watch your condition.  Will get help right away if you are not doing well or get worse. Document Released: 08/21/2005 Document Revised: 11/13/2011 Document Reviewed: 06/24/2009 [facility]  Patient Information 2015 [employer], [place]. This information is not intended to replace advice given to you by your health care provider. Make sure you discuss any questions you have with your health care provider.

## 2014-08-15 NOTE — ED Notes (Addendum)
Pt from home via [facility] c/o dizziness "for a long time" burning in her belly, right ear pain, back pain, and she has been off her medication "Novain" for 2 weeks. Pt is listing multiple complaints. She is ambulatory with steady gait. Pt denies SI or HI.

## 2014-08-15 NOTE — ED Notes (Signed)
Patient's right ear irrigated.  No wax removed.  Patient able to remove a small amount with a cotton tipped applicator.

## 2014-08-15 NOTE — ED Notes (Signed)
Pt in from home by [facility], c/o dizziness "for a long time." Burning in her belly, R ear pain, back pain, and she has been off her med "Novain" for 2 weeks. Pt is listing multiple complaints. She is ambulatory with steady gait. Denies SI/HI. Hx schizophrenia per record.

## 2014-08-15 NOTE — ED Provider Notes (Signed)
CSN: [id number]     Arrival date & time 08/15/14  1153 History   First MD Initiated Contact with Patient 08/15/14 1210     Chief Complaint  Patient presents with  . Dizziness  . Otalgia  . Back Pain     HPI Comments: Pt has had trouble with dizziness for years.  She was on medications in the past for depression and psychosis.  She felt like the medications made her dizzness worse so she stopped taking her medications.  She came in to the ED today because she was feeling dizzy again today.  She also feels like her ears were stopped up but the new issue that brought her in was her kidneys and bladder   Patient is a 53 y.o. female presenting with dizziness, ear pain, and back pain. The history is provided by the patient.  Dizziness Description: "dizzy", not vertigo, no lightheadedness. Severity:  Moderate Onset quality:  Gradual Duration: It started several years ago. Relieved by: gets better when she drinks tea. Worsened by:  Nothing tried Associated symptoms: no blood in stool, no chest pain, no diarrhea, no hearing loss, no shortness of breath and no vomiting   Otalgia Associated symptoms: no diarrhea, no hearing loss and no vomiting   Back Pain Associated symptoms: no chest pain and no dysuria   SHe has also noticed a strong urine odor and this concerned her.  Past Medical History  Diagnosis Date  . Anemia   . Esophageal hiatus hernia   . Depression   . Schizophrenia   . Cameron ulcer    Past Surgical History  Procedure Laterality Date  . Appendectomy 1992    . Laparoscopic nissen fundoplication  july 2012   Family History  Problem Relation Age of Onset  . Heart disease Father   . Cancer Maternal Grandfather     lung  . Heart disease Paternal Grandfather    History  Substance Use Topics  . Smoking status: Never Smoker   . Smokeless tobacco: Not on file  . Alcohol Use: 0.5 oz/week    1 drink(s) per week   OB History    No data available     Review of  Systems  HENT: Positive for ear pain. Negative for hearing loss.   Respiratory: Negative for shortness of breath.   Cardiovascular: Negative for chest pain.  Gastrointestinal: Negative for vomiting, diarrhea and blood in stool.  Genitourinary: Negative for dysuria, frequency, hematuria and flank pain.  Musculoskeletal: Positive for back pain.  Neurological: Positive for dizziness.      Allergies  Penicillins and Shellfish allergy  Home Medications   Prior to Admission medications   Medication Sig Start Date End Date Taking? Authorizing Provider  dimenhyDRINATE (DRAMAMINE) 50 MG tablet Take 50 mg by mouth every 8 (eight) hours as needed for dizziness.   Yes Historical Provider, MD  Fe Cbn-Fe Gluc-FA-B12-C-DSS (FERRALET 90) 90-1 MG TABS Take 1 tablet by mouth daily.  06/29/14  Yes Historical Provider, MD  naproxen sodium (ANAPROX) 220 MG tablet Take 220 mg by mouth as needed (pain, headaches).   Yes Historical Provider, MD  thiothixene (NAVANE) 5 MG capsule Take 5 mg by mouth daily.     Yes Historical Provider, MD  carbamide peroxide (DEBROX) 6.5 % otic solution Place 5 drops into the right ear 2 (two) times daily. 08/15/14   Linwood DibblesJon Jacquelyn Shadrick, MD  meclizine (ANTIVERT) 25 MG tablet Take 1 tablet (25 mg total) by mouth 3 (three) times daily as needed  for dizziness. 08/15/14   Linwood DibblesJon Dorr Perrot, MD   BP 140/89 mmHg  Pulse 77  Temp(Src) 97.6 F (36.4 C) (Oral)  Resp 20  SpO2 95% Physical Exam  Constitutional: She is oriented to person, place, and time. She appears well-developed and well-nourished. No distress.  HENT:  Head: Normocephalic and atraumatic.  Right Ear: External ear normal.  Left Ear: External ear normal.  Mouth/Throat: Oropharynx is clear and moist.  Cerumen impaction right ear   Eyes: Conjunctivae are normal. Right eye exhibits no discharge. Left eye exhibits no discharge. No scleral icterus.  Neck: Neck supple. No tracheal deviation present.  Cardiovascular: Normal rate, regular  rhythm and intact distal pulses.   Pulmonary/Chest: Effort normal and breath sounds normal. No stridor. No respiratory distress. She has no wheezes. She has no rales.  Abdominal: Soft. Bowel sounds are normal. She exhibits no distension. There is no tenderness. There is no rebound and no guarding.  Musculoskeletal: She exhibits no edema or tenderness.  Neurological: She is alert and oriented to person, place, and time. She has normal strength. No cranial nerve deficit (no facial droop, extraocular movements intact, no slurred speech) or sensory deficit. She exhibits normal muscle tone. She displays no seizure activity. Coordination normal.  No pronator drift bilateral upper extrem, able to hold both legs off bed for 5 seconds, sensation intact in all extremities, no visual field cuts, no left or right sided neglect, normal finger-nose exam bilaterally, no nystagmus noted   Skin: Skin is warm and dry. No rash noted.  Psychiatric: She has a normal mood and affect.  Nursing note and vitals reviewed.   ED Course  Procedures (including critical care time) Labs Review Labs Reviewed  CBC - Abnormal; Notable for the following:    RDW 17.5 (*)    All other components within normal limits  URINALYSIS, ROUTINE W REFLEX MICROSCOPIC - Abnormal; Notable for the following:    APPearance CLOUDY (*)    Leukocytes, UA TRACE (*)    All other components within normal limits  URINE MICROSCOPIC-ADD ON - Abnormal; Notable for the following:    Squamous Epithelial / LPF MANY (*)    Bacteria, UA FEW (*)    All other components within normal limits  BASIC METABOLIC PANEL   Medications  hydrogen peroxide 3 % external solution (  Given 08/15/14 1411)     MDM   Final diagnoses:  Cerumen impaction, right  Dizziness    Ear irrigation performed by nursing staff.  Will also dc home with debrox solution.  Dizziness has been chronic.  No focal neuro deficits.  ? Peripheral vertigo.   Possibly some  psychosomatic component.  At this time there does not appear to be any evidence of an acute emergency medical condition and the patient appears stable for discharge with appropriate outpatient follow up.    Linwood DibblesJon Rennee Coyne, MD 08/15/14 [phone]

## 2014-08-15 NOTE — ED Notes (Signed)
Pt is aware that a urine sample is needed.
